# Patient Record
Sex: Female | Born: 1970 | Race: Black or African American | Hispanic: No | Marital: Single | Smoking: Never smoker
Health system: Southern US, Community
[De-identification: ages and names within clinical notes are randomized; demographics above are authoritative.]

## PROBLEM LIST (undated history)

## (undated) DIAGNOSIS — J45909 Unspecified asthma, uncomplicated: Secondary | ICD-10-CM

## (undated) DIAGNOSIS — I639 Cerebral infarction, unspecified: Secondary | ICD-10-CM

## (undated) DIAGNOSIS — IMO0002 Reserved for concepts with insufficient information to code with codable children: Secondary | ICD-10-CM

## (undated) DIAGNOSIS — Q248 Other specified congenital malformations of heart: Secondary | ICD-10-CM

## (undated) DIAGNOSIS — I10 Essential (primary) hypertension: Secondary | ICD-10-CM

## (undated) DIAGNOSIS — M329 Systemic lupus erythematosus, unspecified: Secondary | ICD-10-CM

---

## 1988-03-30 HISTORY — PX: OVARIAN CYST SURGERY: SHX726

## 2010-02-21 HISTORY — PX: ANKLE SURGERY: SHX546

## 2010-02-21 HISTORY — PX: KNEE SURGERY: SHX244

## 2021-04-04 ENCOUNTER — Other Ambulatory Visit: Payer: Self-pay

## 2021-04-04 ENCOUNTER — Emergency Department (HOSPITAL_COMMUNITY): Payer: Medicaid - Out of State

## 2021-04-04 ENCOUNTER — Emergency Department (HOSPITAL_COMMUNITY)
Admission: EM | Admit: 2021-04-04 | Discharge: 2021-04-05 | Disposition: A | Discharge status: Home / Self Care | Payer: Medicaid - Out of State | Attending: Emergency Medicine | Admitting: Emergency Medicine

## 2021-04-04 ENCOUNTER — Encounter (HOSPITAL_COMMUNITY): Payer: Self-pay | Admitting: Emergency Medicine

## 2021-04-04 DIAGNOSIS — N12 Tubulo-interstitial nephritis, not specified as acute or chronic: Secondary | ICD-10-CM | POA: Diagnosis not present

## 2021-04-04 DIAGNOSIS — L932 Other local lupus erythematosus: Secondary | ICD-10-CM | POA: Insufficient documentation

## 2021-04-04 DIAGNOSIS — R519 Headache, unspecified: Secondary | ICD-10-CM | POA: Insufficient documentation

## 2021-04-04 DIAGNOSIS — R109 Unspecified abdominal pain: Secondary | ICD-10-CM | POA: Diagnosis present

## 2021-04-04 DIAGNOSIS — Z20822 Contact with and (suspected) exposure to covid-19: Secondary | ICD-10-CM | POA: Insufficient documentation

## 2021-04-04 DIAGNOSIS — M791 Myalgia, unspecified site: Secondary | ICD-10-CM | POA: Diagnosis not present

## 2021-04-04 DIAGNOSIS — R509 Fever, unspecified: Secondary | ICD-10-CM | POA: Diagnosis not present

## 2021-04-04 HISTORY — DX: Systemic lupus erythematosus, unspecified: M32.9

## 2021-04-04 HISTORY — DX: Reserved for concepts with insufficient information to code with codable children: IMO0002

## 2021-04-04 LAB — COMPREHENSIVE METABOLIC PANEL
ALT: 23 U/L (ref 0–44)
AST: 29 U/L (ref 15–41)
Albumin: 3.6 g/dL (ref 3.5–5.0)
Alkaline Phosphatase: 72 U/L (ref 38–126)
Anion gap: 11 (ref 5–15)
BUN: 18 mg/dL (ref 6–20)
CO2: 25 mmol/L (ref 22–32)
Calcium: 9.8 mg/dL (ref 8.9–10.3)
Chloride: 95 mmol/L — ABNORMAL LOW (ref 98–111)
Creatinine, Ser: 1.27 mg/dL — ABNORMAL HIGH (ref 0.44–1.00)
GFR, Estimated: 51 mL/min — ABNORMAL LOW (ref >60)
Glucose, Bld: 133 mg/dL — ABNORMAL HIGH (ref 70–99)
Potassium: 3.1 mmol/L — ABNORMAL LOW (ref 3.5–5.1)
Sodium: 131 mmol/L — ABNORMAL LOW (ref 135–145)
Total Bilirubin: 1.2 mg/dL (ref 0.3–1.2)
Total Protein: 8.1 g/dL (ref 6.5–8.1)

## 2021-04-04 LAB — CBC WITH DIFFERENTIAL/PLATELET
Abs Immature Granulocytes: 0.05 10*3/uL (ref 0.00–0.07)
Basophils Absolute: 0 10*3/uL (ref 0.0–0.1)
Basophils Relative: 0 %
Eosinophils Absolute: 0 10*3/uL (ref 0.0–0.5)
Eosinophils Relative: 0 %
HCT: 41.2 % (ref 36.0–46.0)
Hemoglobin: 13.8 g/dL (ref 12.0–15.0)
Immature Granulocytes: 1 %
Lymphocytes Relative: 10 %
Lymphs Abs: 0.9 10*3/uL (ref 0.7–4.0)
MCH: 26 pg (ref 26.0–34.0)
MCHC: 33.5 g/dL (ref 30.0–36.0)
MCV: 77.7 fL — ABNORMAL LOW (ref 80.0–100.0)
Monocytes Absolute: 0.7 10*3/uL (ref 0.1–1.0)
Monocytes Relative: 8 %
Neutro Abs: 7.4 10*3/uL (ref 1.7–7.7)
Neutrophils Relative %: 81 %
Platelets: 226 10*3/uL (ref 150–400)
RBC: 5.3 MIL/uL — ABNORMAL HIGH (ref 3.87–5.11)
RDW: 13.3 % (ref 11.5–15.5)
WBC: 9.1 10*3/uL (ref 4.0–10.5)
nRBC: 0 % (ref 0.0–0.2)

## 2021-04-04 LAB — URINALYSIS, ROUTINE W REFLEX MICROSCOPIC
Bilirubin Urine: NEGATIVE
Glucose, UA: NEGATIVE mg/dL
Ketones, ur: NEGATIVE mg/dL
Nitrite: NEGATIVE
Protein, ur: NEGATIVE mg/dL
Specific Gravity, Urine: 1.013 (ref 1.005–1.030)
pH: 5 (ref 5.0–8.0)

## 2021-04-04 LAB — RESP PANEL BY RT-PCR (FLU A&B, COVID) ARPGX2
Influenza A by PCR: NEGATIVE
Influenza B by PCR: NEGATIVE
SARS Coronavirus 2 by RT PCR: NEGATIVE

## 2021-04-04 LAB — LIPASE, BLOOD: Lipase: 25 U/L (ref 11–51)

## 2021-04-04 MED ORDER — MORPHINE SULFATE (PF) 4 MG/ML IV SOLN
4.0000 mg | Freq: Once | INTRAVENOUS | Status: AC
  Administered 2021-04-04: 4 mg via INTRAVENOUS
  Filled 2021-04-04: qty 1

## 2021-04-04 MED ORDER — ONDANSETRON HCL 4 MG/2ML IJ SOLN
4.0000 mg | Freq: Once | INTRAMUSCULAR | Status: AC
  Administered 2021-04-04: 4 mg via INTRAVENOUS
  Filled 2021-04-04: qty 2

## 2021-04-04 MED ORDER — IOHEXOL 300 MG/ML  SOLN
75.0000 mL | Freq: Once | INTRAMUSCULAR | Status: AC | PRN
  Administered 2021-04-04: 75 mL via INTRAVENOUS

## 2021-04-04 MED ORDER — ACETAMINOPHEN 500 MG PO TABS
1000.0000 mg | ORAL_TABLET | Freq: Once | ORAL | Status: AC
  Administered 2021-04-04: 1000 mg via ORAL
  Filled 2021-04-04: qty 2

## 2021-04-04 MED ORDER — SODIUM CHLORIDE 0.9 % IV BOLUS
1000.0000 mL | Freq: Once | INTRAVENOUS | Status: AC
  Administered 2021-04-04: 1000 mL via INTRAVENOUS

## 2021-04-04 MED ORDER — CEPHALEXIN 500 MG PO CAPS: 500.0000 mg | ORAL_CAPSULE | Freq: Two times a day (BID) | ORAL | 0 refills | Status: AC

## 2021-04-04 MED ORDER — ONDANSETRON 4 MG PO TBDP: 4.0000 mg | ORAL_TABLET | Freq: Three times a day (TID) | ORAL | 0 refills | Status: AC | PRN

## 2021-04-04 MED ORDER — SODIUM CHLORIDE 0.9 % IV SOLN
1.0000 g | Freq: Once | INTRAVENOUS | Status: AC
  Administered 2021-04-04: 1 g via INTRAVENOUS
  Filled 2021-04-04: qty 10

## 2021-04-04 NOTE — ED Provider Notes (Signed)
[facility] EMERGENCY DEPARTMENT Provider Note   CSN: [id number] Arrival date & time: 04/04/21  1204     History Chief Complaint  Patient presents with   Lupus    Adriana Freeman is a 50 y.o. female.  HPI Is a recent transplant from [place].  She has a history of lupus, with complications including DVT.  She is on multiple medications daily, states that she has been compliant with these until today.  Now over the past 3 days she has had persistent pain diffuse head, abdomen, muscles.  She has fevers and chills subjectively, but is unaware of objective temperature increase until today. No confusion, disorientation.  She has been intolerant of oral meds over the past 24 hours due to nausea, vomiting.  Pain is severe, as above improved with anything as she is intolerant of oral intake. She is here with her son who assists with history.    Past Medical History:  Diagnosis Date   Lupus ([facility])      OB History   No obstetric history on file.     No family history on file.     Home Medications Prior to Admission medications   Not on File    Allergies    Patient has no known allergies.  Review of Systems   Review of Systems  Constitutional:        Per HPI, otherwise negative  HENT:         Per HPI, otherwise negative  Respiratory:         Per HPI, otherwise negative  Cardiovascular:        Per HPI, otherwise negative  Gastrointestinal:  Positive for nausea and vomiting.  Endocrine:       Negative aside from HPI  Genitourinary:        Neg aside from HPI   Musculoskeletal:        Per HPI, otherwise negative  Skin: Negative.   Allergic/Immunologic: Positive for immunocompromised state.  Neurological:  Negative for syncope.   Physical Exam Updated Vital Signs BP (!) 148/111   Pulse 89   Temp 98.6 F (37 C) (Oral)   Resp 17   Ht 5\' 11"  (1.803 m)   Wt 88.5 kg   SpO2 98%   BMI 27.20 kg/m   Physical Exam Vitals and nursing note  reviewed.  Constitutional:      General: She is not in acute distress.    Appearance: She is well-developed.  HENT:     Head: Normocephalic and atraumatic.  Eyes:     Conjunctiva/sclera: Conjunctivae normal.  Cardiovascular:     Rate and Rhythm: Normal rate and regular rhythm.  Pulmonary:     Effort: Pulmonary effort is normal. No respiratory distress.     Breath sounds: Normal breath sounds. No stridor.  Abdominal:     General: There is no distension.     Tenderness: There is abdominal tenderness.  Skin:    General: Skin is warm and dry.  Neurological:     Mental Status: She is alert and oriented to person, place, and time.     Cranial Nerves: No cranial nerve deficit.    ED Results / Procedures / Treatments   Labs (all labs ordered are listed, but only abnormal results are displayed) Labs Reviewed  COMPREHENSIVE METABOLIC PANEL - Abnormal; Notable for the following components:      Result Value   Sodium 131 (*)    Potassium 3.1 (*)    Chloride  95 (*)    Glucose, Bld 133 (*)    Creatinine, Ser 1.27 (*)    GFR, Estimated 51 (*)    All other components within normal limits  CBC WITH DIFFERENTIAL/PLATELET - Abnormal; Notable for the following components:   RBC 5.30 (*)    MCV 77.7 (*)    All other components within normal limits  RESP PANEL BY RT-PCR (FLU A&B, COVID) ARPGX2  LIPASE, BLOOD  URINALYSIS, ROUTINE W REFLEX MICROSCOPIC  MAGNESIUM    EKG EKG Interpretation  Date/Time:  Friday April 04 2021 15:22:19 EST Ventricular Rate:  105 PR Interval:  178 QRS Duration: 88 QT Interval:  338 QTC Calculation: 446 R Axis:   53 Text Interpretation: Sinus tachycardia Biatrial enlargement Minimal voltage criteria for LVH, may be normal variant ( Sokolow-Lyon ) Cannot rule out Anterior infarct , age undetermined Abnormal ECG Confirmed by Carmin Muskrat [phone]) on 04/04/2021 7:54:31 PM  Radiology DG Chest 1 View  Result Date: 04/04/2021 CLINICAL DATA:   Hypertension.  Fever. EXAM: CHEST  1 VIEW COMPARISON:  None. FINDINGS: The heart size and mediastinal contours are within normal limits. Both lungs are clear. The visualized skeletal structures are unremarkable. IMPRESSION: No active disease. Electronically Signed   By: Dorise Bullion III M.D.   On: 04/04/2021 20:29   CT HEAD WO CONTRAST (5MM)  Result Date: 04/04/2021 CLINICAL DATA:  History of lupus with severe headache. EXAM: CT HEAD WITHOUT CONTRAST TECHNIQUE: Contiguous axial images were obtained from the base of the skull through the vertex without intravenous contrast. COMPARISON:  None. FINDINGS: Brain: No evidence of acute infarction, hemorrhage, hydrocephalus, extra-axial collection or mass lesion/mass effect. Vascular: No hyperdense vessel or unexpected calcification. Skull: Normal. Negative for fracture or focal lesion. Sinuses/Orbits: No acute finding. Other: None. IMPRESSION: No acute intracranial pathology. Electronically Signed   By: Virgina Norfolk M.D.   On: 04/04/2021 16:53    Procedures Procedures   Medications Ordered in ED Medications  sodium chloride 0.9 % bolus 1,000 mL (has no administration in time range)  morphine 4 MG/ML injection 4 mg (has no administration in time range)  ondansetron (ZOFRAN) injection 4 mg (has no administration in time range)  acetaminophen (TYLENOL) tablet 1,000 mg (1,000 mg Oral Given 04/04/21 1847)    ED Course  I have reviewed the triage vital signs and the nursing notes.  Pertinent labs & imaging results that were available during my care of the patient were reviewed by me and considered in my medical decision making (see chart for details).   11:35 PM Patient resting, calm, no distress, speaking clearly.  She is hemodynamically unremarkable.  We discussed all findings I reviewed her CT, CT, labs, urinalysis concerning for UTI, possible early pyelonephritis.  No evidence of bacteremia, sepsis and patient is improved substantially with  fluids, analgesics here.  Patient will receive antibiotics, be discharged to follow-up with primary care. MDM Rules/Calculators/A&P MDM Number of Diagnoses or Management Options Fever: new, needed workup Pyelonephritis: new, needed workup   Amount and/or Complexity of Data Reviewed Clinical lab tests: ordered and reviewed Tests in the radiology section of CPT: ordered and reviewed Tests in the medicine section of CPT: reviewed and ordered Decide to obtain previous medical records or to obtain history from someone other than the patient: yes Obtain history from someone other than the patient: yes Review and summarize past medical records: yes Independent visualization of images, tracings, or specimens: yes  Risk of Complications, Morbidity, and/or Mortality Presenting problems: high Diagnostic procedures:  high Management options: high  Critical Care Total time providing critical care: < 30 minutes  Patient Progress Patient progress: improved   Final Clinical Impression(s) / ED Diagnoses Final diagnoses:  Pyelonephritis     Carmin Muskrat, MD 04/04/21 2342

## 2021-04-04 NOTE — ED Provider Notes (Addendum)
Emergency Medicine Provider Triage Evaluation Note  Adriana Freeman , a 50 y.o. female  was evaluated in triage.  Pt complains of generalized myalgia, generalized weakness, headache, abdominal pain and balance issues.  Patient reports that she has had the symptoms over the last few days.  Patient Dors is history of lupus and believes this is due to a lupus flare.  Review of Systems  Positive: Generalized myalgia, generalized weakness, headache, abdominal pain, balance issues, chills, subjective fevers Negative: Numbness, weakness, dysarthria, visual disturbance, fevers  Physical Exam  BP (!) 159/121 (BP Location: Left Arm)   Pulse 91   Temp 98.4 F (36.9 C) (Oral)   Resp 20   SpO2 100%  Gen:   Awake, no distress   Resp:  Normal effort, lungs clear to auscultation bilaterally MSK:   Moves extremities without difficulty  Other:  CN II through XII intact.  +5 strength to bilateral upper and lower extremities.  Pronator drift negative.  Normal finger-nose.  Abdomen soft, nondistended, nontender.  Medical Decision Making  Medically screening exam initiated at 3:07 PM.  Appropriate orders placed.  Adriana Freeman was informed that the remainder of the evaluation will be completed by another provider, this initial triage assessment does not replace that evaluation, and the importance of remaining in the ED until their evaluation is complete.  Patient reports previous history is of CVA.  Will obtain CT head at this time.  Will order respiratory panel, blood work, urinalysis, and chest x-ray to evaluate for possible source of infection.   Adriana Beckwith, PA-C 04/04/21 1510    Adriana Beckwith, PA-C 04/04/21 1513    Adriana Rasmussen, MD 04/04/21 [phone]

## 2021-04-04 NOTE — ED Triage Notes (Signed)
Patient coming from home, complaint of balance issues, states she thinks she is having  Lupus flair up. Pt hypertensive w/ hx of same.

## 2021-04-04 NOTE — Discharge Instructions (Signed)
Please be sure to schedule an appointment with our community health clinic.  Return here for concerning changes in your condition.

## 2021-08-25 ENCOUNTER — Encounter (HOSPITAL_COMMUNITY): Payer: Self-pay

## 2021-08-25 ENCOUNTER — Ambulatory Visit (HOSPITAL_COMMUNITY)
Admission: EM | Admit: 2021-08-25 | Discharge: 2021-08-25 | Disposition: A | Discharge status: Home / Self Care | Payer: 59

## 2021-08-25 DIAGNOSIS — M5441 Lumbago with sciatica, right side: Secondary | ICD-10-CM | POA: Diagnosis not present

## 2021-08-25 DIAGNOSIS — G8929 Other chronic pain: Secondary | ICD-10-CM | POA: Diagnosis not present

## 2021-08-25 DIAGNOSIS — M5442 Lumbago with sciatica, left side: Secondary | ICD-10-CM | POA: Diagnosis not present

## 2021-08-25 HISTORY — DX: Unspecified asthma, uncomplicated: J45.909

## 2021-08-25 HISTORY — DX: Essential (primary) hypertension: I10

## 2021-08-25 HISTORY — DX: Other specified congenital malformations of heart: Q24.8

## 2021-08-25 MED ORDER — KETOROLAC TROMETHAMINE 60 MG/2ML IM SOLN
60.0000 mg | Freq: Once | INTRAMUSCULAR | Status: AC
  Administered 2021-08-25: 60 mg via INTRAMUSCULAR

## 2021-08-25 MED ORDER — PREDNISONE 10 MG (21) PO TBPK: ORAL_TABLET | ORAL | 0 refills | Status: AC

## 2021-08-25 MED ORDER — KETOROLAC TROMETHAMINE 60 MG/2ML IM SOLN
INTRAMUSCULAR | Status: AC
  Filled 2021-08-25: qty 2

## 2021-08-25 MED ORDER — BACLOFEN 10 MG PO TABS: 5.0000 mg | ORAL_TABLET | Freq: Three times a day (TID) | ORAL | 0 refills | Status: AC | PRN

## 2021-08-25 NOTE — Discharge Instructions (Signed)
Toradol injection given today. Take prednisone as prescribed and baclofen as needed as prescribed. Establish with ortho/spine specialist for going management.  ?[place] ?[phone] ? ?Go to the ER if acute worsening pain or new concerning symptoms such as bowel/bladder incontinence or genital numbness.  ? ?
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
## 2022-05-14 ENCOUNTER — Emergency Department (HOSPITAL_COMMUNITY): Payer: BLUE CROSS/BLUE SHIELD

## 2022-05-14 ENCOUNTER — Other Ambulatory Visit: Payer: Self-pay

## 2022-05-14 ENCOUNTER — Emergency Department (HOSPITAL_COMMUNITY)
Admission: EM | Admit: 2022-05-14 | Discharge: 2022-05-14 | Disposition: A | Discharge status: Home / Self Care | Payer: BLUE CROSS/BLUE SHIELD | Attending: Emergency Medicine | Admitting: Emergency Medicine

## 2022-05-14 DIAGNOSIS — S0990XA Unspecified injury of head, initial encounter: Secondary | ICD-10-CM

## 2022-05-14 DIAGNOSIS — I10 Essential (primary) hypertension: Secondary | ICD-10-CM | POA: Insufficient documentation

## 2022-05-14 DIAGNOSIS — S0292XA Unspecified fracture of facial bones, initial encounter for closed fracture: Secondary | ICD-10-CM | POA: Diagnosis not present

## 2022-05-14 DIAGNOSIS — C20 Malignant neoplasm of rectum: Secondary | ICD-10-CM | POA: Insufficient documentation

## 2022-05-14 DIAGNOSIS — Z7901 Long term (current) use of anticoagulants: Secondary | ICD-10-CM | POA: Insufficient documentation

## 2022-05-14 DIAGNOSIS — J45909 Unspecified asthma, uncomplicated: Secondary | ICD-10-CM | POA: Insufficient documentation

## 2022-05-14 DIAGNOSIS — Z79899 Other long term (current) drug therapy: Secondary | ICD-10-CM | POA: Insufficient documentation

## 2022-05-14 MED ORDER — ACETAMINOPHEN 325 MG PO TABS
650.0000 mg | ORAL_TABLET | Freq: Once | ORAL | Status: AC
  Administered 2022-05-14: 650 mg via ORAL
  Filled 2022-05-14: qty 2
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
## 2022-05-16 ENCOUNTER — Encounter (HOSPITAL_COMMUNITY): Payer: Self-pay | Admitting: Emergency Medicine

## 2022-05-16 ENCOUNTER — Other Ambulatory Visit: Payer: Self-pay

## 2022-05-16 ENCOUNTER — Emergency Department (HOSPITAL_COMMUNITY): Payer: Commercial Managed Care - HMO

## 2022-05-16 ENCOUNTER — Emergency Department (HOSPITAL_COMMUNITY)
Admission: EM | Admit: 2022-05-16 | Discharge: 2022-05-16 | Disposition: A | Discharge status: Home / Self Care | Payer: Commercial Managed Care - HMO | Attending: Emergency Medicine | Admitting: Emergency Medicine

## 2022-05-16 DIAGNOSIS — S0083XA Contusion of other part of head, initial encounter: Secondary | ICD-10-CM | POA: Insufficient documentation

## 2022-05-16 DIAGNOSIS — Z7901 Long term (current) use of anticoagulants: Secondary | ICD-10-CM | POA: Diagnosis not present

## 2022-05-16 DIAGNOSIS — S0990XA Unspecified injury of head, initial encounter: Secondary | ICD-10-CM | POA: Diagnosis present

## 2022-05-16 DIAGNOSIS — R519 Headache, unspecified: Secondary | ICD-10-CM

## 2022-05-16 HISTORY — DX: Cerebral infarction, unspecified: I63.9

## 2022-05-16 MED ORDER — ACETAMINOPHEN 500 MG PO TABS
1000.0000 mg | ORAL_TABLET | Freq: Once | ORAL | Status: AC
  Administered 2022-05-16: 1000 mg via ORAL
  Filled 2022-05-16: qty 2

## 2022-05-16 MED ORDER — ONDANSETRON 4 MG PO TBDP
4.0000 mg | ORAL_TABLET | Freq: Once | ORAL | Status: AC
  Administered 2022-05-16: 4 mg via ORAL
  Filled 2022-05-16: qty 1

## 2022-05-16 MED ORDER — ONDANSETRON HCL 4 MG PO TABS: 4.0000 mg | ORAL_TABLET | Freq: Four times a day (QID) | ORAL | 0 refills | Status: AC
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
## 2022-05-16 NOTE — Discharge Instructions (Addendum)
Return for any problem.   Use Tylenol as directed for pain.  Use Zofran as prescribed for nausea.

## 2022-05-16 NOTE — ED Notes (Signed)
Patient transported to CT 
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
## 2023-02-22 ENCOUNTER — Emergency Department (HOSPITAL_COMMUNITY)
Admission: EM | Admit: 2023-02-22 | Discharge: 2023-02-22 | Disposition: A | Discharge status: Home / Self Care | Payer: Medicaid Other | Attending: Emergency Medicine | Admitting: Emergency Medicine

## 2023-02-22 ENCOUNTER — Other Ambulatory Visit: Payer: Self-pay

## 2023-02-22 ENCOUNTER — Encounter (HOSPITAL_COMMUNITY): Payer: Self-pay | Admitting: *Deleted

## 2023-02-22 DIAGNOSIS — K0889 Other specified disorders of teeth and supporting structures: Secondary | ICD-10-CM | POA: Insufficient documentation

## 2023-02-22 DIAGNOSIS — Z7901 Long term (current) use of anticoagulants: Secondary | ICD-10-CM | POA: Insufficient documentation

## 2023-02-22 DIAGNOSIS — I10 Essential (primary) hypertension: Secondary | ICD-10-CM | POA: Diagnosis not present

## 2023-02-22 DIAGNOSIS — J45909 Unspecified asthma, uncomplicated: Secondary | ICD-10-CM | POA: Insufficient documentation

## 2023-02-22 MED ORDER — NAPROXEN 500 MG PO TABS: 500.0000 mg | ORAL_TABLET | Freq: Two times a day (BID) | ORAL | 0 refills | Status: AC

## 2023-02-22 MED ORDER — KETOROLAC TROMETHAMINE 15 MG/ML IJ SOLN
15.0000 mg | Freq: Once | INTRAMUSCULAR | Status: AC
  Administered 2023-02-22: 15 mg via INTRAMUSCULAR
  Filled 2023-02-22: qty 1

## 2023-02-22 NOTE — Discharge Instructions (Addendum)
Please take the previously prescribed antibiotics.  I have prescribed anti-inflammatory medication to be taken as directed.  It is very important that you schedule a follow-up appointment with dentistry for definitive management

## 2023-02-22 NOTE — ED Triage Notes (Signed)
Pt reported right sided dental pain for about 3 days, swelling started yesterday. Did a tele health visit, prescribed chlorhexidine mouthwash and antibiotics. Pt has had 2 doses of antibiotics.

## 2023-02-22 NOTE — ED Provider Notes (Signed)
[place] EMERGENCY DEPARTMENT AT [facility] Provider Note   CSN: [id number] Arrival date & time: 02/22/23  0046     History  Chief Complaint  Patient presents with   Dental Pain    Adriana Freeman is a 52 y.o. female.  Patient presents to the emergency room complaining of right upper sided dental pain which has been ongoing for approximately 3 days.  She started noticing swelling yesterday and had a telehealth visit.  She was prescribed Augmentin and a chlorhexidine mouthwash.  She has taken 2 doses of Augmentin as of this time.  She presents to the emergency room complaining of continued pain and swelling.  She denies any worsening of the swelling, fevers, nausea, vomiting.  She believes that she has some complications from previously broken tooth.  [place] this area recently.  She does not have dentistry in this area.  Past medical history significant for lupus, asthma, history of stroke, hypertension   Dental Pain      Home Medications Prior to Admission medications   Medication Sig Start Date End Date Taking? Authorizing Provider  naproxen (NAPROSYN) 500 MG tablet Take 1 tablet (500 mg total) by mouth 2 (two) times daily. 02/22/23  Yes Barrie Dunker B, PA-C  atorvastatin (LIPITOR) 10 MG tablet Take 10 mg by mouth daily.    [provider]  chlorthalidone (HYGROTON) 25 MG tablet Take 25 mg by mouth daily.    [provider]  hydroxychloroquine (PLAQUENIL) 200 MG tablet Take 400 mg by mouth daily.    [provider]  losartan (COZAAR) 50 MG tablet Take 50 mg by mouth daily.    [provider]  montelukast (SINGULAIR) 10 MG tablet Take 10 mg by mouth at bedtime.    [provider]  ondansetron (ZOFRAN ODT) 4 MG disintegrating tablet Take 1 tablet (4 mg total) by mouth every 8 (eight) hours as needed for nausea or vomiting. 04/04/21   Gerhard Munch, MD  ondansetron  (ZOFRAN) 4 MG tablet Take 1 tablet (4 mg total) by mouth every 6 (six) hours. 05/16/22   Wynetta Fines, MD  predniSONE (STERAPRED UNI-PAK 21 TAB) 10 MG (21) TBPK tablet Take as directed 08/25/21   Vallery Sa, Amy L, PA  rivaroxaban (XARELTO) 20 MG TABS tablet Take 20 mg by mouth daily with supper.    [provider]      Allergies    Patient has no known allergies.    Review of Systems   Review of Systems  Physical Exam Updated Vital Signs BP (!) 150/114 (BP Location: Left Arm)   Pulse 76   Temp 98.1 F (36.7 C) (Oral)   Resp 18   SpO2 96%  Physical Exam Vitals and nursing note reviewed.  HENT:     Head: Normocephalic and atraumatic.     Mouth/Throat:   Eyes:     Pupils: Pupils are equal, round, and reactive to light.  Pulmonary:     Effort: Pulmonary effort is normal. No respiratory distress.  Musculoskeletal:        General: No signs of injury.     Cervical back: Normal range of motion.  Skin:    General: Skin is dry.  Neurological:     Mental Status: She is alert.  Psychiatric:        Speech: Speech normal.        Behavior: Behavior normal.     ED Results /  Procedures / Treatments   Labs (all labs ordered are listed, but only abnormal results are displayed) Labs Reviewed - No data to display  EKG None  Radiology No results found.  Procedures Procedures    Medications Ordered in ED Medications  ketorolac (TORADOL) 15 MG/ML injection 15 mg (15 mg Intramuscular Given 02/22/23 0214)    ED Course/ Medical Decision Making/ A&P                                 Medical Decision Making Risk Prescription drug management.   This patient presents to the ED for concern of dental pain, this involves an extensive number of treatment options, and is a complaint that carries with it a high risk of complications and morbidity.  The differential diagnosis includes dental injury, infection, dental caries, others   Co morbidities that complicate the  patient evaluation  History of dental surgery, stroke   Additional history obtained:  Additional history obtained from friend at bedside   Problem List / ED Course / Critical interventions / Medication management   I ordered medication including Toradol for inflammation Reevaluation of the patient after these medicines showed that the patient improved I have reviewed the patients home medicines and have made adjustments as needed   Social Determinants of Health:  Patient has Medicaid for primary health insurance type   Test / Admission - Considered:  Patient's presentation most consistent with dental injury with signs of mild infection.  No drainable abscess appreciated.  She has only had 2 doses of Augmentin and this is not long enough to consider this failure of treatment.  Plan to have patient continue to take Augmentin at home as prescribed.  I will prescribe a short course of Naprosyn to help with inflammation.  I explained to the patient the importance of dental follow-up for definitive management and the patient voices understanding.  Discharge home at this time with return precautions.         Final Clinical Impression(s) / ED Diagnoses Final diagnoses:  Pain, dental    Rx / DC Orders ED Discharge Orders          Ordered    naproxen (NAPROSYN) 500 MG tablet  2 times daily        02/22/23 0209              Darrick Grinder, PA-C 02/22/23 0259    Nira Conn, MD 02/23/23 [phone]

## 2023-03-26 ENCOUNTER — Emergency Department (HOSPITAL_COMMUNITY)
Admission: EM | Admit: 2023-03-26 | Discharge: 2023-03-27 | Disposition: A | Discharge status: Home / Self Care | Payer: BLUE CROSS/BLUE SHIELD | Attending: Emergency Medicine | Admitting: Emergency Medicine

## 2023-03-26 ENCOUNTER — Other Ambulatory Visit: Payer: Self-pay

## 2023-03-26 ENCOUNTER — Encounter (HOSPITAL_COMMUNITY): Payer: Self-pay

## 2023-03-26 DIAGNOSIS — Z7901 Long term (current) use of anticoagulants: Secondary | ICD-10-CM | POA: Diagnosis not present

## 2023-03-26 DIAGNOSIS — L0232 Furuncle of buttock: Secondary | ICD-10-CM | POA: Insufficient documentation

## 2023-03-26 DIAGNOSIS — Z7951 Long term (current) use of inhaled steroids: Secondary | ICD-10-CM | POA: Diagnosis not present

## 2023-03-26 DIAGNOSIS — J45909 Unspecified asthma, uncomplicated: Secondary | ICD-10-CM | POA: Diagnosis not present

## 2023-03-26 DIAGNOSIS — I10 Essential (primary) hypertension: Secondary | ICD-10-CM | POA: Insufficient documentation

## 2023-03-26 DIAGNOSIS — Z8673 Personal history of transient ischemic attack (TIA), and cerebral infarction without residual deficits: Secondary | ICD-10-CM | POA: Insufficient documentation

## 2023-03-26 DIAGNOSIS — Z79899 Other long term (current) drug therapy: Secondary | ICD-10-CM | POA: Insufficient documentation

## 2023-03-26 DIAGNOSIS — Z7952 Long term (current) use of systemic steroids: Secondary | ICD-10-CM | POA: Diagnosis not present

## 2023-03-26 NOTE — ED Triage Notes (Signed)
Pt has multiple boils in her buttock and vaginal area. Pt states that it hurts to sit down.

## 2023-03-27 DIAGNOSIS — L0232 Furuncle of buttock: Secondary | ICD-10-CM | POA: Diagnosis not present

## 2023-03-27 LAB — CBC WITH DIFFERENTIAL/PLATELET
Abs Immature Granulocytes: 0.06 10*3/uL (ref 0.00–0.07)
Basophils Absolute: 0 10*3/uL (ref 0.0–0.1)
Basophils Relative: 0 %
Eosinophils Absolute: 0.2 10*3/uL (ref 0.0–0.5)
Eosinophils Relative: 2 %
HCT: 37.8 % (ref 36.0–46.0)
Hemoglobin: 12.8 g/dL (ref 12.0–15.0)
Immature Granulocytes: 1 %
Lymphocytes Relative: 33 %
Lymphs Abs: 3.3 10*3/uL (ref 0.7–4.0)
MCH: 27.5 pg (ref 26.0–34.0)
MCHC: 33.9 g/dL (ref 30.0–36.0)
MCV: 81.3 fL (ref 80.0–100.0)
Monocytes Absolute: 0.4 10*3/uL (ref 0.1–1.0)
Monocytes Relative: 4 %
Neutro Abs: 6 10*3/uL (ref 1.7–7.7)
Neutrophils Relative %: 60 %
Platelets: 250 10*3/uL (ref 150–400)
RBC: 4.65 MIL/uL (ref 3.87–5.11)
RDW: 14.3 % (ref 11.5–15.5)
WBC: 9.9 10*3/uL (ref 4.0–10.5)
nRBC: 0 % (ref 0.0–0.2)

## 2023-03-27 LAB — COMPREHENSIVE METABOLIC PANEL
ALT: 10 U/L (ref 0–44)
AST: 14 U/L — ABNORMAL LOW (ref 15–41)
Albumin: 3.9 g/dL (ref 3.5–5.0)
Alkaline Phosphatase: 54 U/L (ref 38–126)
Anion gap: 10 (ref 5–15)
BUN: 21 mg/dL — ABNORMAL HIGH (ref 6–20)
CO2: 28 mmol/L (ref 22–32)
Calcium: 9.8 mg/dL (ref 8.9–10.3)
Chloride: 98 mmol/L (ref 98–111)
Creatinine, Ser: 1.07 mg/dL — ABNORMAL HIGH (ref 0.44–1.00)
GFR, Estimated: >60 mL/min (ref >60)
Glucose, Bld: 127 mg/dL — ABNORMAL HIGH (ref 70–99)
Potassium: 3.7 mmol/L (ref 3.5–5.1)
Sodium: 136 mmol/L (ref 135–145)
Total Bilirubin: 0.5 mg/dL (ref <1.2)
Total Protein: 7.7 g/dL (ref 6.5–8.1)

## 2023-03-27 MED ORDER — DOXYCYCLINE HYCLATE 100 MG PO CAPS: 100.0000 mg | ORAL_CAPSULE | Freq: Two times a day (BID) | ORAL | 0 refills | Status: AC

## 2023-03-27 MED ORDER — DOXYCYCLINE HYCLATE 100 MG PO TABS
100.0000 mg | ORAL_TABLET | Freq: Once | ORAL | Status: AC
  Administered 2023-03-27: 100 mg via ORAL
  Filled 2023-03-27: qty 1

## 2023-03-27 MED ORDER — OXYCODONE-ACETAMINOPHEN 5-325 MG PO TABS
1.0000 | ORAL_TABLET | Freq: Once | ORAL | Status: AC
  Administered 2023-03-27: 1 via ORAL
  Filled 2023-03-27: qty 1

## 2023-03-27 NOTE — Discharge Instructions (Signed)
Take the prescribed medication as directed.  Can apply warm compresses to area or soak in warm tub. Follow-up with family doctor when able. Return to the ED for new or worsening symptoms-- high fever, increased redness/drainage, etc.

## 2023-03-27 NOTE — ED Provider Notes (Signed)
[place] EMERGENCY DEPARTMENT AT [facility] Provider Note   CSN: [id number] Arrival date & time: 03/26/23  2318     History  Chief Complaint  Patient presents with   Abscess    Adriana Freeman is a 52 y.o. female.  The history is provided by the patient and medical records.  Abscess  52 year old female with history of lupus, asthma, prior stroke, hypertension, presenting to the ED with boils.  Patient states over the past week she has had boils pop up on her right buttock and in the vaginal area.  States these are painful.  She has had some drainage, mostly looks like white pus.  States area is just very tender and sensitive, especially when trying to sit upright.  She has not had any fevers.  She is not diabetic.  Home Medications Prior to Admission medications   Medication Sig Start Date End Date Taking? Authorizing Provider  albuterol (VENTOLIN HFA) 108 (90 Base) MCG/ACT inhaler Inhale 2 puffs into the lungs every 6 (six) hours as needed for wheezing or shortness of breath. 01/04/23  Yes [provider]  atorvastatin (LIPITOR) 10 MG tablet Take 10 mg by mouth daily.   Yes [provider]  chlorthalidone (HYGROTON) 25 MG tablet Take 25 mg by mouth daily.   Yes [provider]  doxycycline (VIBRAMYCIN) 100 MG capsule Take 1 capsule (100 mg total) by mouth 2 (two) times daily. 03/27/23  Yes Garlon Hatchet, PA-C  escitalopram (LEXAPRO) 10 MG tablet Take 10 mg by mouth daily. 01/04/23  Yes [provider]  gabapentin (NEURONTIN) 100 MG capsule Take 100 mg by mouth at bedtime. 01/04/23  Yes [provider]  hydroxychloroquine (PLAQUENIL) 200 MG tablet Take 400 mg by mouth daily.   Yes [provider]  losartan (COZAAR) 50 MG tablet Take 50 mg by mouth daily.   Yes [provider]  rivaroxaban (XARELTO) 20 MG TABS tablet Take 20 mg by mouth daily with supper.   Yes [provider]  naproxen (NAPROSYN) 500  MG tablet Take 1 tablet (500 mg total) by mouth 2 (two) times daily. Patient not taking: Reported on 03/27/2023 02/22/23   Barrie Dunker B, PA-C  ondansetron (ZOFRAN ODT) 4 MG disintegrating tablet Take 1 tablet (4 mg total) by mouth every 8 (eight) hours as needed for nausea or vomiting. Patient not taking: Reported on 03/27/2023 04/04/21   Gerhard Munch, MD  ondansetron (ZOFRAN) 4 MG tablet Take 1 tablet (4 mg total) by mouth every 6 (six) hours. Patient not taking: Reported on 03/27/2023 05/16/22   Wynetta Fines, MD  predniSONE (STERAPRED UNI-PAK 21 TAB) 10 MG (21) TBPK tablet Take as directed Patient not taking: Reported on 03/27/2023 08/25/21   Estanislado Pandy, PA      Allergies    Patient has no known allergies.    Review of Systems   Review of Systems  Skin:        abscess  All other systems reviewed and are negative.   Physical Exam Updated Vital Signs BP 117/81 (BP Location: Right Arm)   Pulse 63   Temp 98 F (36.7 C) (Oral)   Resp 16   Ht 5\' 11"  (1.803 m)   Wt 95.7 kg   SpO2 99%   BMI 29.43 kg/m   Physical Exam Vitals and nursing note reviewed.  Constitutional:      Appearance: She is well-developed.  HENT:     Head: Normocephalic and atraumatic.  Eyes:  Conjunctiva/sclera: Conjunctivae normal.     Pupils: Pupils are equal, round, and reactive to light.  Cardiovascular:     Rate and Rhythm: Normal rate and regular rhythm.     Heart sounds: Normal heart sounds.  Pulmonary:     Effort: Pulmonary effort is normal.     Breath sounds: Normal breath sounds.  Abdominal:     General: Bowel sounds are normal.     Palpations: Abdomen is soft.  Genitourinary:    Comments: Small boil noted to right buttock and in right groin fold, there is some hardening present but no fluctuance, no cellulitic changes, no tissue crepitus, no bleeding or active drainage Musculoskeletal:        General: Normal range of motion.     Cervical back: Normal range of motion.  Skin:     General: Skin is warm and dry.  Neurological:     Mental Status: She is alert and oriented to person, place, and time.     ED Results / Procedures / Treatments   Labs (all labs ordered are listed, but only abnormal results are displayed) Labs Reviewed  COMPREHENSIVE METABOLIC PANEL - Abnormal; Notable for the following components:      Result Value   Glucose, Bld 127 (*)    BUN 21 (*)    Creatinine, Ser 1.07 (*)    AST 14 (*)    All other components within normal limits  CBC WITH DIFFERENTIAL/PLATELET    EKG None  Radiology No results found.  Procedures Procedures    Medications Ordered in ED Medications  oxyCODONE-acetaminophen (PERCOCET/ROXICET) 5-325 MG per tablet 1 tablet (1 tablet Oral Given 03/27/23 0314)  doxycycline (VIBRA-TABS) tablet 100 mg (100 mg Oral Given 03/27/23 0314)    ED Course/ Medical Decision Making/ A&P                                 Medical Decision Making Amount and/or Complexity of Data Reviewed Labs: ordered. ECG/medicine tests: ordered and independent interpretation performed.  Risk Prescription drug management.   52 year old female presenting to the ED with boils, 1 to buttock and 1 to genital region.  These have occurred over the past week.  She has had some drainage.  She is afebrile and nontoxic in appearance.  Areas are small, 1 to right medial buttock, one to right groin fold.  She has no cellulitic change, active drainage, or bleeding.  Areas are actually quite firm without fluctuance.  There is no tissue crepitus.  Patient has no fever, tachycardia, or hypotension.  Does not appear septic.  Labs reassuring without leukocytosis or electrolyte derangement.  She has no history of diabetes.  Exam findings are not concerning for Fournier's gangrene at this time.  She denies any prior history of MRSA.  Areas do not appear to need I&D at this time.  Will start on abx and have her follow-up with PCP.  Return here for new concerns (high  fever, redness, increased pain/drainage, etc).  Final Clinical Impression(s) / ED Diagnoses Final diagnoses:  Boil of buttock    Rx / DC Orders ED Discharge Orders          Ordered    doxycycline (VIBRAMYCIN) 100 MG capsule  2 times daily        03/27/23 0340              Garlon Hatchet, PA-C 03/27/23 0343    Palumbo, April,  MD 03/27/23 [phone]

## 2023-04-23 IMAGING — CT CT ABD-PELV W/ CM
2 of 5 series · 16 of 46 positions shown, 18 images · IV contrast (omnipaque)
Comparison: Chest x-ray from earlier in the same day.

CLINICAL DATA: Abdominal pain

EXAM:
CT ABDOMEN AND PELVIS WITH CONTRAST
TECHNIQUE: Multidetector CT imaging of the abdomen and pelvis was performed
using the standard protocol following bolus administration of
intravenous contrast.
CONTRAST:  75mL OMNIPAQUE IOHEXOL 300 MG/ML  SOLN

[Series 3: abdomen 5.0 · axial · 0.98mm/px · z∈[+916,+1310]mm · 13 of 93 slices shown, 15 images]
[im 7/93  soft-tissue]
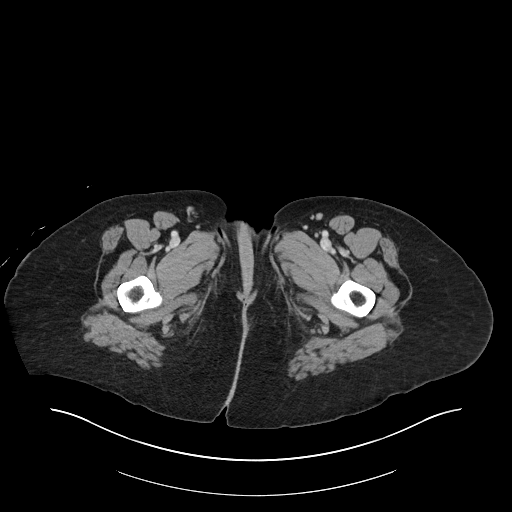
[im 7/93  bone]
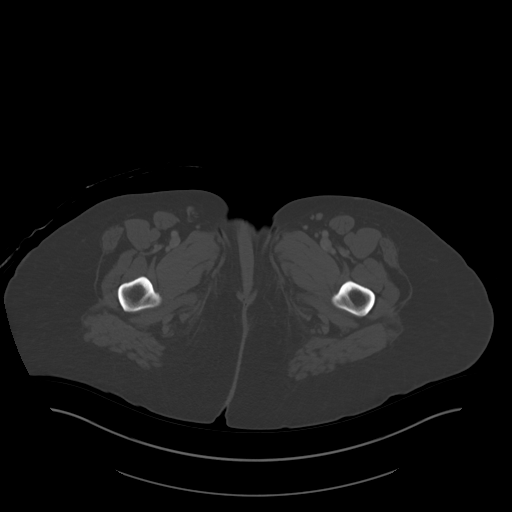
[im 13/93  soft-tissue]
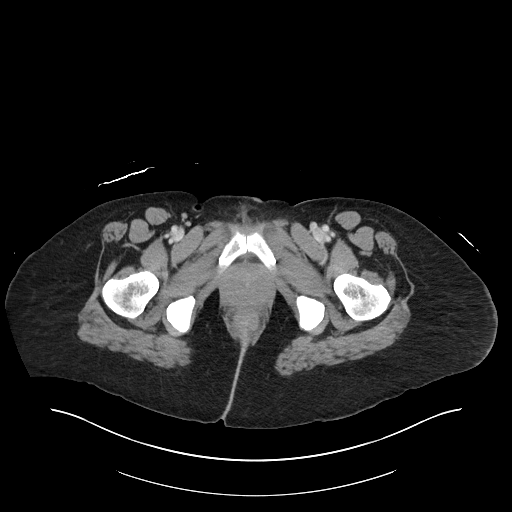
[im 19/93  soft-tissue]
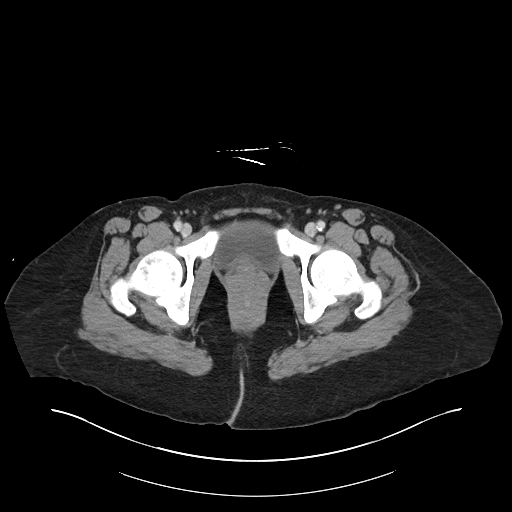
[im 25/93  soft-tissue]
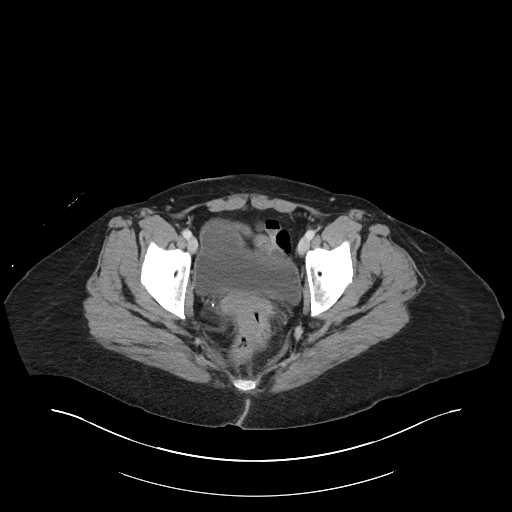
[im 31/93  soft-tissue]
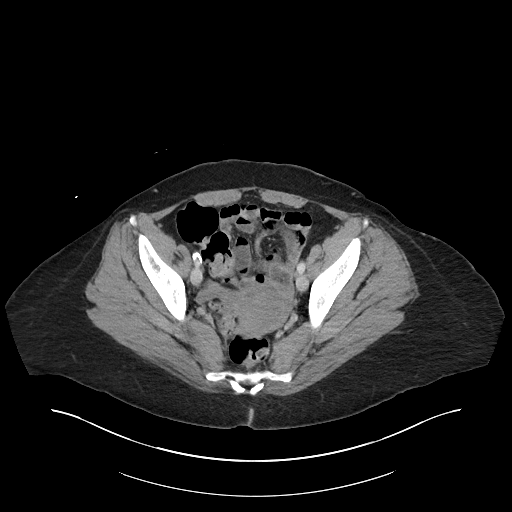
[im 37/93  soft-tissue]
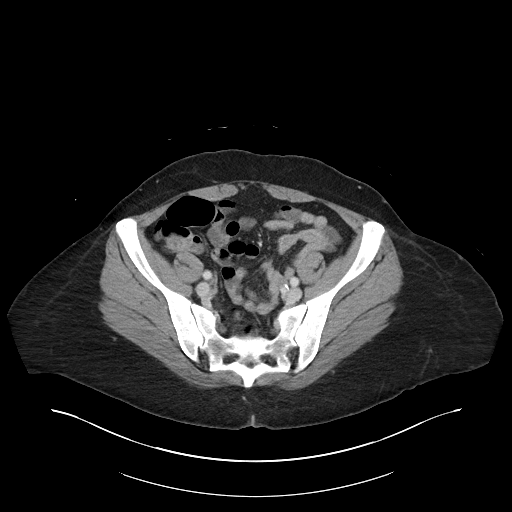
[im 50/93  soft-tissue]
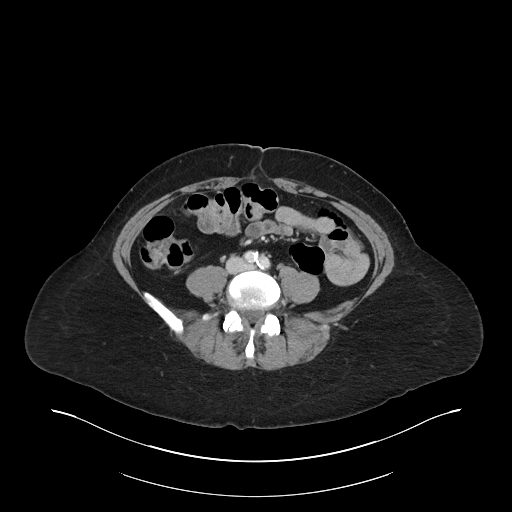
[im 56/93  soft-tissue]
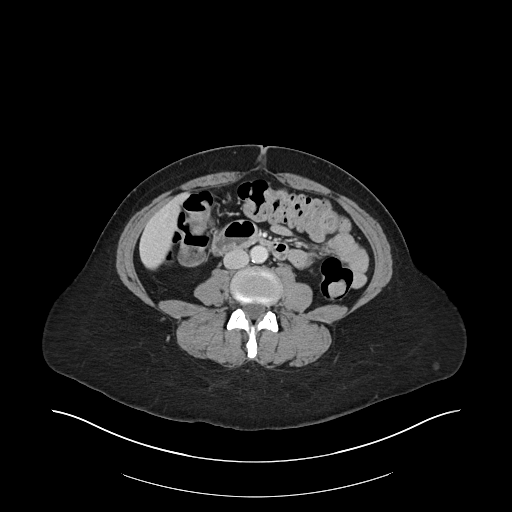
[im 62/93  soft-tissue]
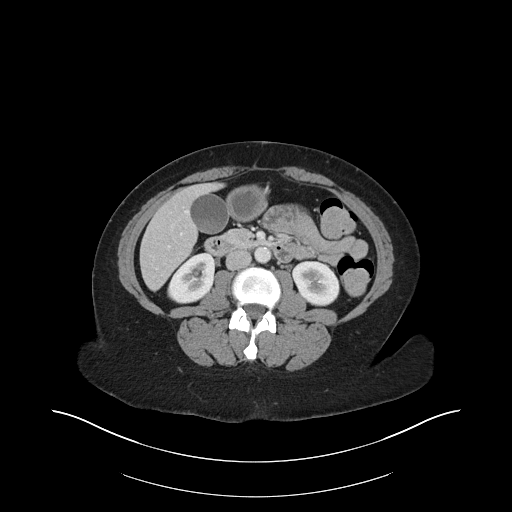
[im 62/93  bone]
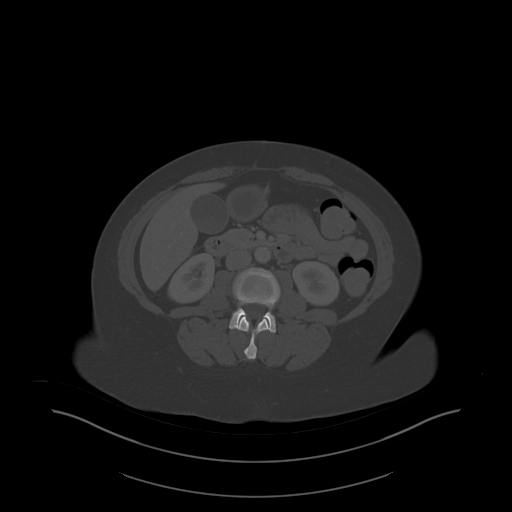
[im 68/93  soft-tissue]
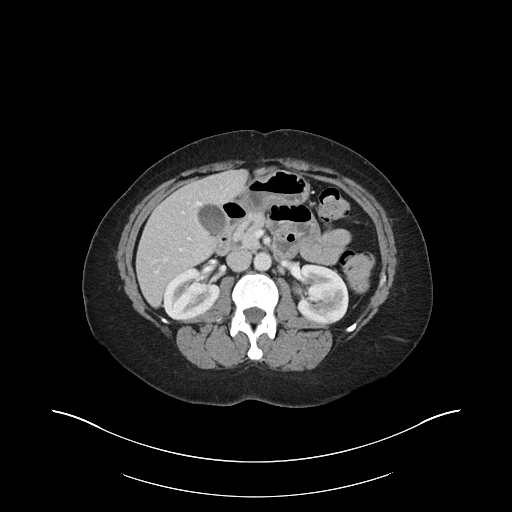
[im 74/93  soft-tissue]
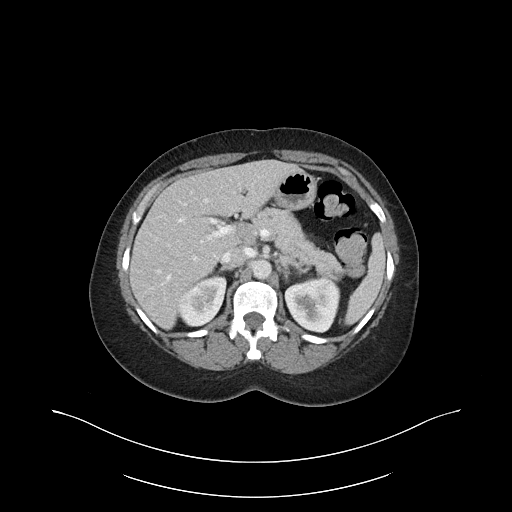
[im 80/93  soft-tissue]
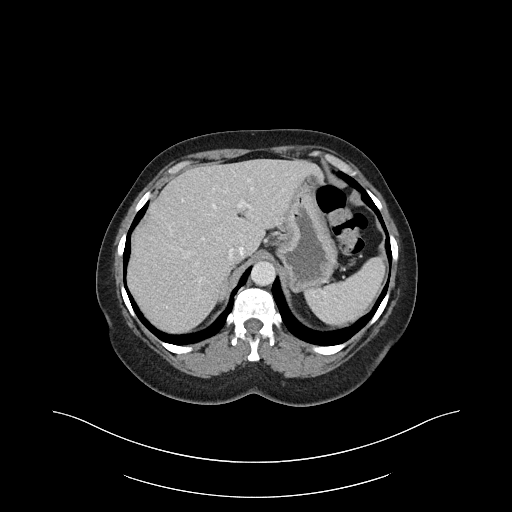
[im 86/93  soft-tissue]
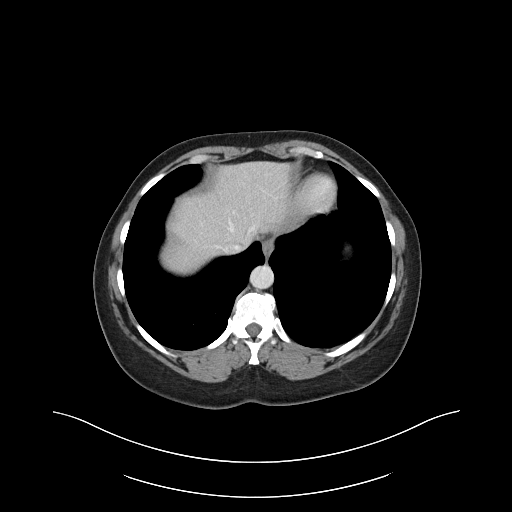

[Series 6: abdomen 3.0 mpr cor · coronal · 0.84mm/px · 3 of 100 slices shown]
[im 34/100  soft-tissue]
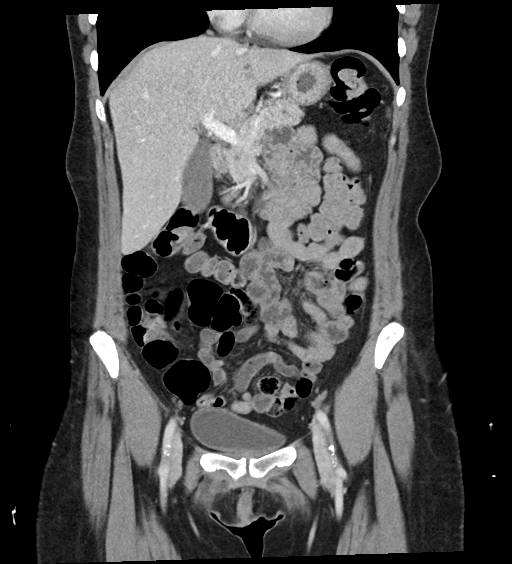
[im 45/100  soft-tissue]
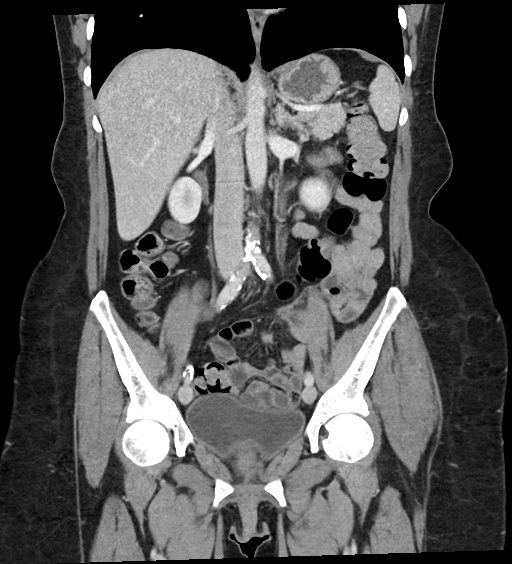
[im 56/100  soft-tissue]
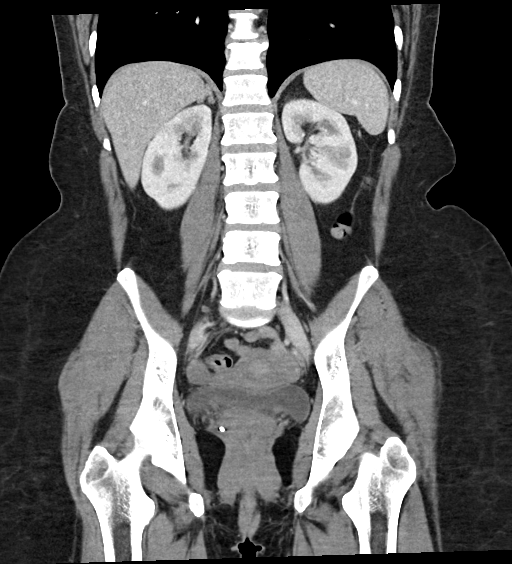

[16 of 46 positions shown; findings below may reference images not displayed]

FINDINGS: Lower chest: No acute abnormality.

Hepatobiliary: Liver is within normal limits. Gallbladder is well
distended with dependent density consistent with gallbladder sludge.
No definitive stones are seen.

Pancreas: Unremarkable. No pancreatic ductal dilatation or
surrounding inflammatory changes.

Spleen: Normal in size without focal abnormality.

Adrenals/Urinary Tract: Adrenal glands are within normal limits. No
renal calculi or obstructive changes are noted. The ureters are
within normal limits. On delayed images, the kidneys demonstrate
mottled enhancement right greater than left suspicious for
underlying pyelonephritis. Correlation with laboratory values is
recommended. No other focal abnormality is noted.

Stomach/Bowel: No obstructive or inflammatory changes of the colon
are seen. The appendix is partially visualized and within normal
limits. Small bowel and stomach are unremarkable.

Vascular/Lymphatic: Aortic atherosclerosis. No enlarged abdominal or
pelvic lymph nodes.

Reproductive: Uterus and bilateral adnexa are unremarkable.

Other: No abdominal wall hernia or abnormality. No abdominopelvic
ascites.

Musculoskeletal: No acute or significant osseous findings.
IMPRESSION: Mottled enhancement pattern in the kidneys bilaterally highly
suspicious for pyelonephritis. Correlation with laboratory values is
recommended. No other focal abnormality is noted.

## 2023-04-23 IMAGING — CT CT HEAD W/O CM
3 series · 16 of 47 positions shown, 19 images · non-contrast
Comparison: None.

CLINICAL DATA: History of lupus with severe headache.

EXAM:
CT HEAD WITHOUT CONTRAST
TECHNIQUE: Contiguous axial images were obtained from the base of the skull
through the vertex without intravenous contrast.

[Series 2: head 5.0 h31s · axial · 0.39mm/px · z∈[-76,+58]mm · 10 of 33 slices shown, 13 images]
[im 3/33  brain]
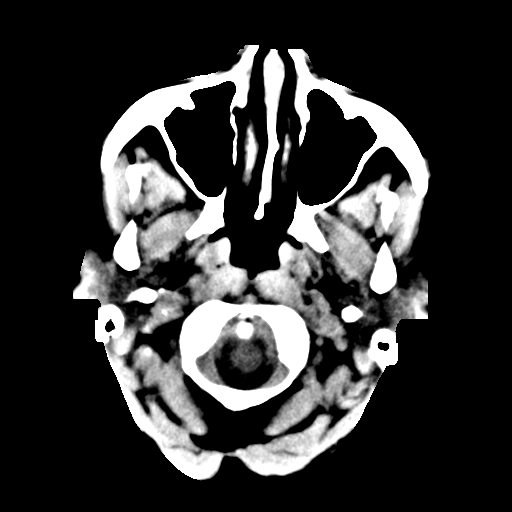
[im 3/33  bone]
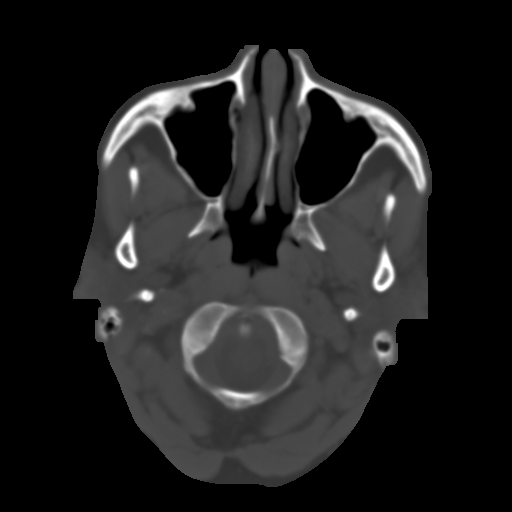
[im 6/33  brain]
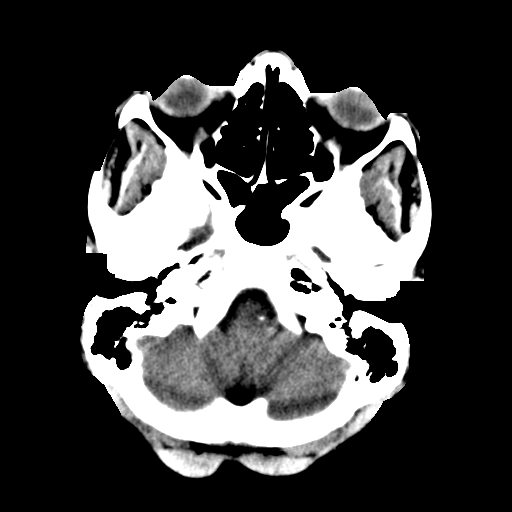
[im 9/33  brain]
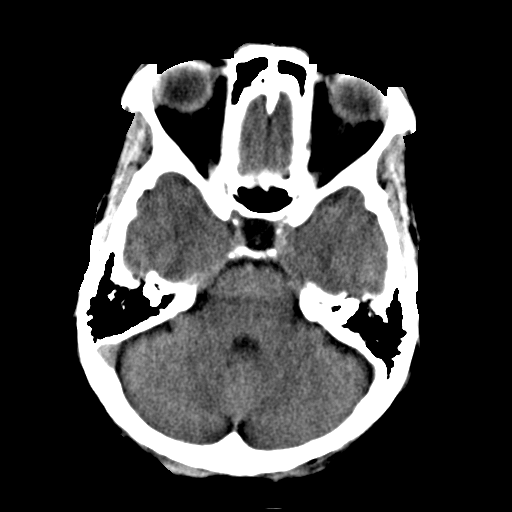
[im 12/33  brain]
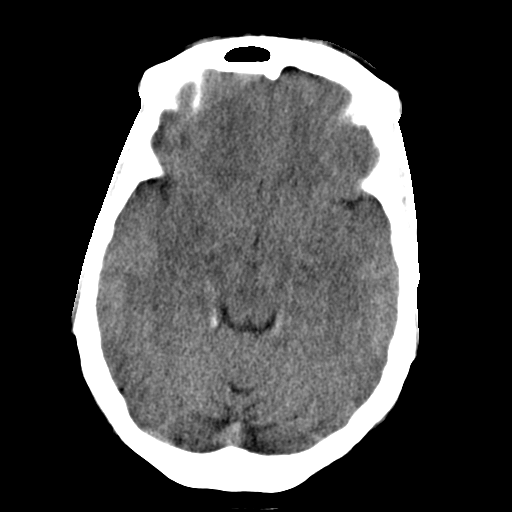
[im 15/33  brain]
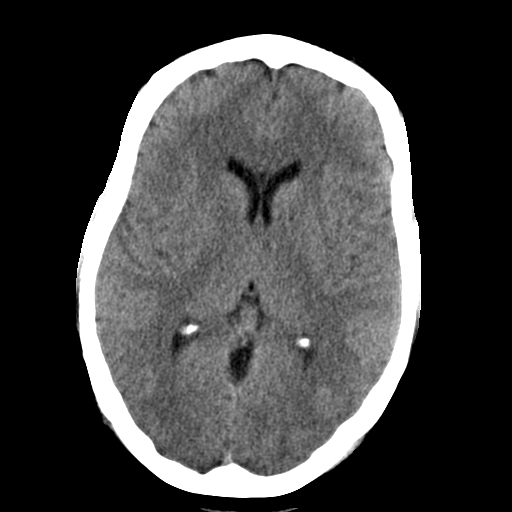
[im 15/33  bone]
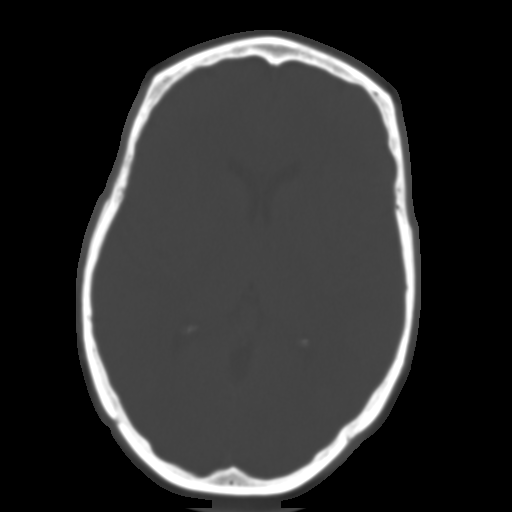
[im 18/33  brain]
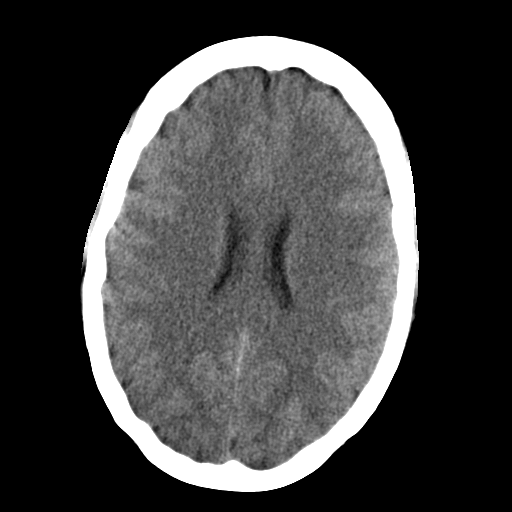
[im 21/33  brain]
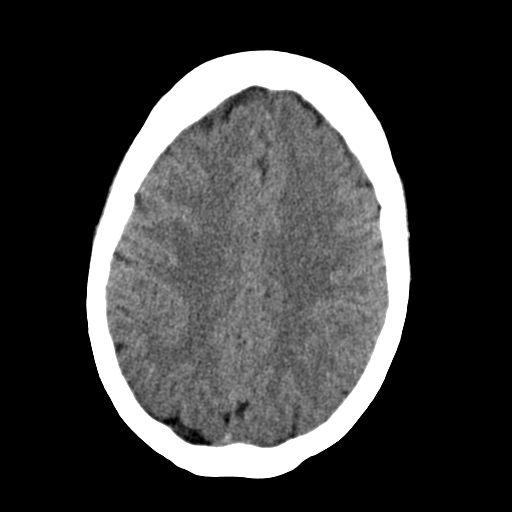
[im 25/33  brain]
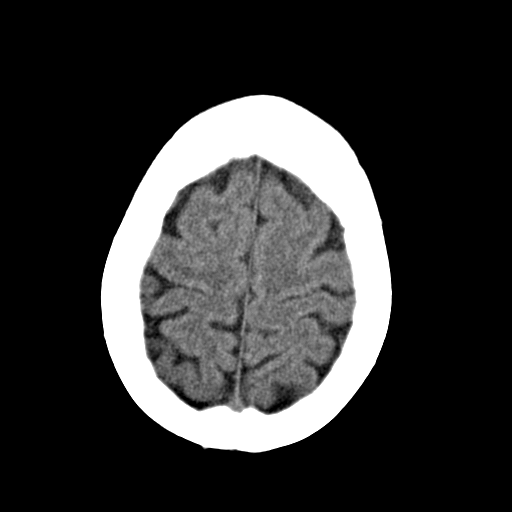
[im 27/33  brain]
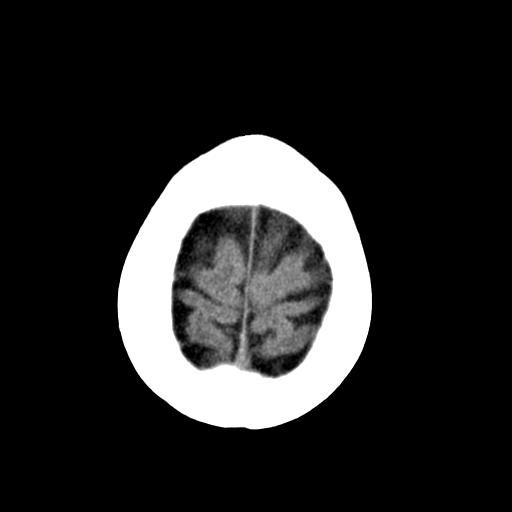
[im 27/33  bone]
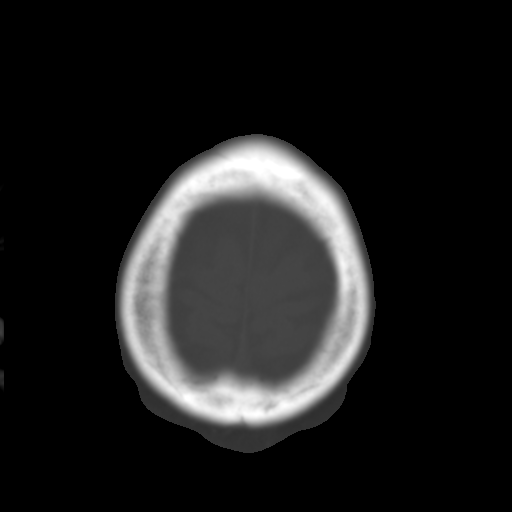
[im 30/33  brain]
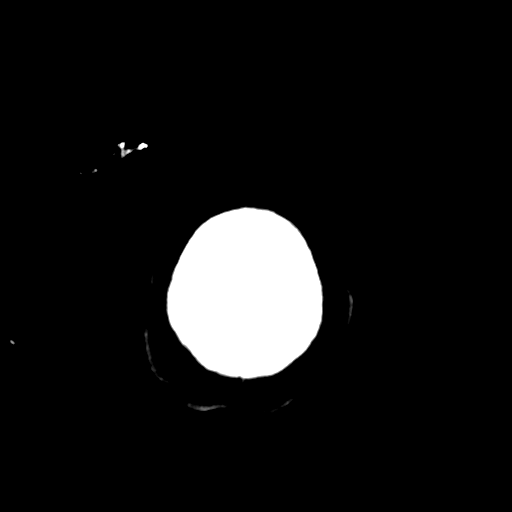

[Series 4: head 3.0 mpr cor · coronal · 0.39mm/px · 3 of 75 slices shown]
[im 25/75  brain]
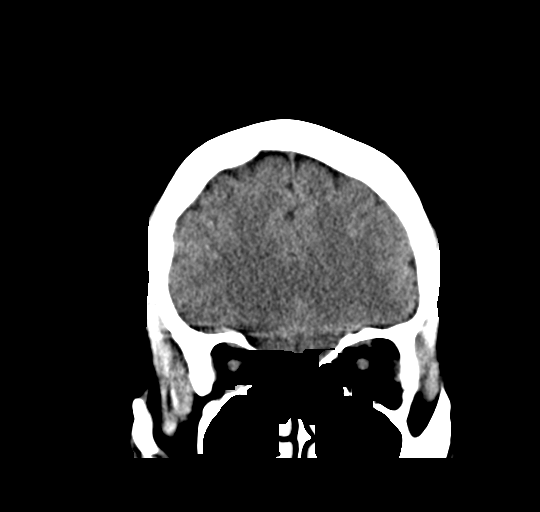
[im 33/75  brain]
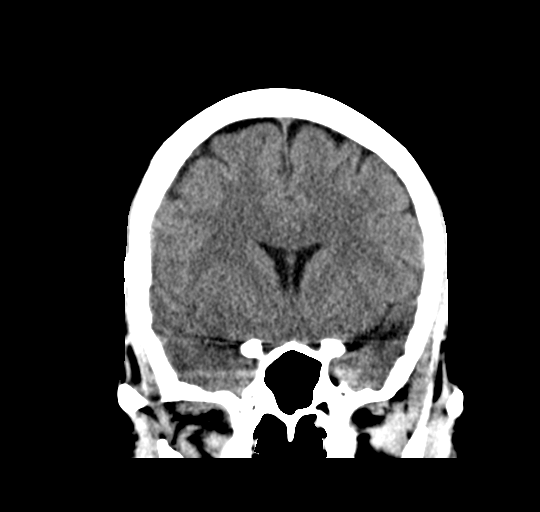
[im 42/75  brain]
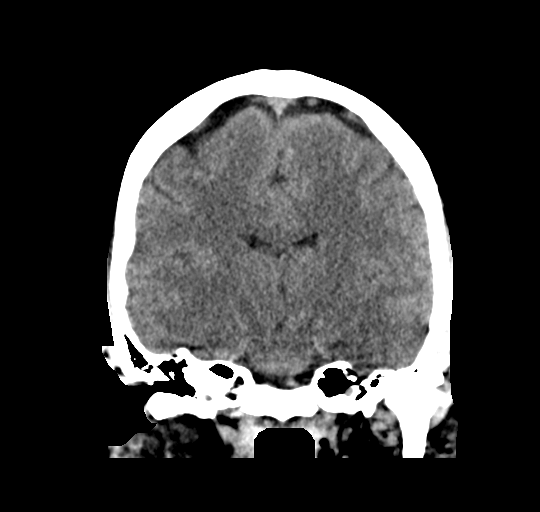

[Series 5: head 3.0 mpr sag · sagittal · 0.39mm/px · 3 of 71 slices shown]
[im 24/71  brain]
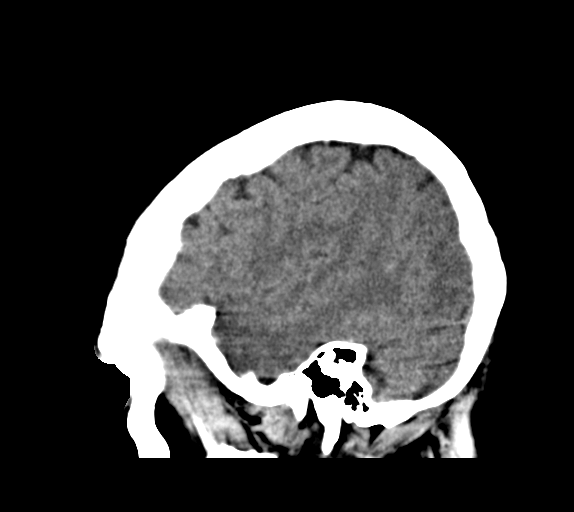
[im 36/71  brain]
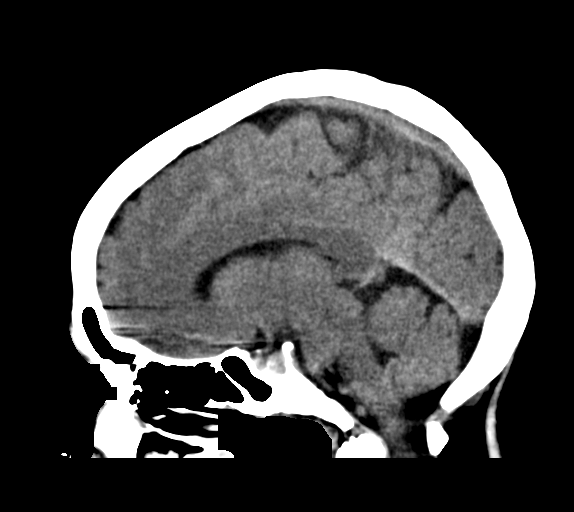
[im 47/71  brain]
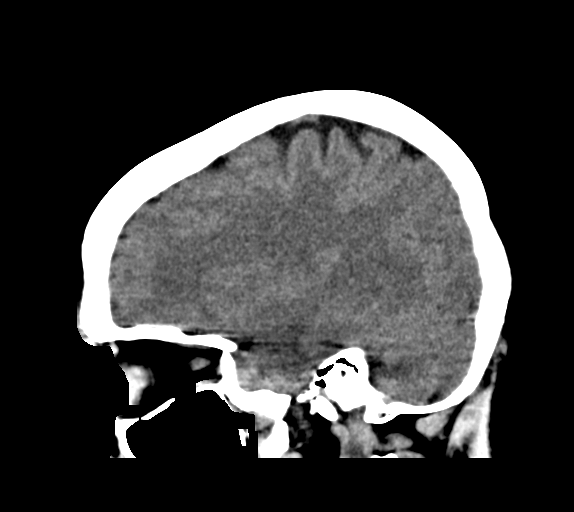

[16 of 47 positions shown; findings below may reference images not displayed]

FINDINGS: Brain: No evidence of acute infarction, hemorrhage, hydrocephalus,
extra-axial collection or mass lesion/mass effect.

Vascular: No hyperdense vessel or unexpected calcification.

Skull: Normal. Negative for fracture or focal lesion.

Sinuses/Orbits: No acute finding.

Other: None.
IMPRESSION: No acute intracranial pathology.
